# Patient Record
Sex: Female | Born: 1960 | Race: White | Hispanic: No | Marital: Married | Smoking: Former smoker
Health system: Southern US, Community
[De-identification: ages and names within clinical notes are randomized; demographics above are authoritative.]

## PROBLEM LIST (undated history)

## (undated) DIAGNOSIS — E559 Vitamin D deficiency, unspecified: Secondary | ICD-10-CM

## (undated) DIAGNOSIS — E039 Hypothyroidism, unspecified: Secondary | ICD-10-CM

## (undated) DIAGNOSIS — I10 Essential (primary) hypertension: Secondary | ICD-10-CM

## (undated) DIAGNOSIS — E785 Hyperlipidemia, unspecified: Secondary | ICD-10-CM

## (undated) DIAGNOSIS — E119 Type 2 diabetes mellitus without complications: Secondary | ICD-10-CM

## (undated) HISTORY — DX: Hypothyroidism, unspecified: E03.9

## (undated) HISTORY — DX: Hyperlipidemia, unspecified: E78.5

## (undated) HISTORY — DX: Essential (primary) hypertension: I10

## (undated) HISTORY — DX: Type 2 diabetes mellitus without complications: E11.9

## (undated) HISTORY — PX: BACK SURGERY: SHX140

## (undated) HISTORY — DX: Vitamin D deficiency, unspecified: E55.9

## (undated) HISTORY — PX: NASAL SINUS SURGERY: SHX719

---

## 2000-02-04 ENCOUNTER — Encounter: Admission: RE | Admit: 2000-02-04 | Discharge: 2000-02-04 | Payer: Self-pay | Admitting: Neurosurgery

## 2000-02-04 ENCOUNTER — Encounter: Payer: Self-pay | Admitting: Neurosurgery

## 2000-02-11 ENCOUNTER — Encounter: Payer: Self-pay | Admitting: Neurosurgery

## 2000-02-12 ENCOUNTER — Encounter: Payer: Self-pay | Admitting: Neurosurgery

## 2000-02-12 ENCOUNTER — Inpatient Hospital Stay (HOSPITAL_COMMUNITY): Admission: RE | Admit: 2000-02-12 | Discharge: 2000-02-13 | Payer: Self-pay | Admitting: Neurosurgery

## 2002-04-04 ENCOUNTER — Encounter: Payer: Self-pay | Admitting: Neurosurgery

## 2002-04-04 ENCOUNTER — Inpatient Hospital Stay (HOSPITAL_COMMUNITY): Admission: RE | Admit: 2002-04-04 | Discharge: 2002-04-07 | Payer: Self-pay | Admitting: Neurosurgery

## 2009-05-17 ENCOUNTER — Encounter: Admission: RE | Admit: 2009-05-17 | Discharge: 2009-05-17 | Payer: Self-pay | Admitting: Orthopedic Surgery

## 2010-07-09 ENCOUNTER — Encounter: Admission: RE | Admit: 2010-07-09 | Discharge: 2010-07-09 | Payer: Self-pay | Admitting: Neurosurgery

## 2011-02-06 NOTE — Op Note (Signed)
[place]. [facility]  Patient:    Valerie Cruz, Valerie Cruz Visit Number: [id number] MRN: [id number]          Service Type: SUR Location: 3000 3021 01 Attending Physician:  Josie Saunders Dictated by:   Danae Orleans Venetia Maxon, M.D. Proc. 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  PROCEDURE: 1. Redo microdiskectomy at L5-S1, left, with microdissection. 2. Transverse lumbar interbody fusion with Synthes 7 mm bone dawe and    morcellized autograft. 3. Pedicle screw fixation at L5-S1 bilaterally. 4. Posterior lateral arthrodesis L5-S1 bilaterally.  SURGEON:  Danae Orleans. Venetia Maxon, M.D.  ASSISTANT:  Clydene Fake, M.D.  ANESTHESIA:  General endotracheal.  ESTIMATED BLOOD LOSS:  350 cc.  COMPLICATIONS:  None.  DISPOSITION:  Recovery room.  INDICATIONS FOR PROCEDURE:  The patient is a 50 year old woman with left lower extremity pain with recurrent disk herniation at L5-S1 on the left.  It was elected to take her to surgery for redo diskectomy and also fusion at the L5-S1 level.  DESCRIPTION OF PROCEDURE:  The patient was brought to the operating room. Following satisfactory and uncomplicated induction of general endotracheal anesthesia and placement of intravenous lines and Foley catheter, she was placed in the prone position on chest rolls.  Her low back was prepped and draped in the usual sterile fashion.  Area of planned incision was infiltrated with 0.25% Marcaine and 0.50% lidocaine with 1:200,000 epinephrine.  Incision was made in the midline, carried through previous scar tissue to the copious adipose tissues of the lumbodorsal  fascia which was then incised bilaterally exposing the L5 and S1 interspace, and the L5 transverse processes bilaterally along with the sacroiliac bilaterally.  Self-retaining retractor was placed, and an intraoperative x-ray confirmed correct orientation.  Then using loop magnification, a completion of the previous laminectomy of L5 on the left was performed with decompression of the left L5 nerve root and the S1 nerve root. The microscope was brought into the field.  There was a significant amount of scar tissue around the S1 nerve root with a large amount of free fragment of disk material directly beneath the S1 nerve root, and this was decompressed using microdissection technique and a variety of pituitary rongeurs.  The interspace was evacuated of residual disk material.  Attention was then turned to placement of pedicle screw fixation which was done using SD90 pedicle screw system.  Two 5.75 x 40 mm screws were placed on the left, one at the L5 level, and the other at the S1 level.  Similarly, side screws were placed on the right.  Positioning of these screws were confirmed on lateral and AP fluoroscopy.  There was a cut out medially of the L5 pedicle on the left which required initially a 6.75 mm screw which was utilized.  It was subsequently felt that a more laterally placed screw of the smaller caliber would be best in terms of positioning in that pedicle, and a 5.75 mm x 40 mm screw was placed with a somewhat more lateral entry point.  This had good purchase and bone, and did not appear to violate the pedicle.  Subsequently, the right-sided screws were placed under distraction, and locking caps were engaged with 50 mm rods.  The interspace was then vigorously curetted and stripped of residual disk material.  A 7 mm Synthes sizer was found to fit well, and the 7 mm bone dawe was then rehydrated.  The morcelized bone autograft was then inserted into the interspace and countersunk and  placed at the depth of the interspace.  Subsequently, the bone graft was placed and countersunk appropriately.  Following that, additional morcellized bone graft was placed overlying the implant.  The distraction was then removed.  The screws were facing.  Posterior lateral arthrodesis was then performed with bone from the L5 transverse processes to the sacral iliac, along with some onlay bone and Vitoss (15 cc rehydrated with pedicle blood aspirate) was packed along the posterior lateral region on the right along within the facet joint at the L5-S1 level.  Forty millimeter rods were then fixed to the screws, locking mechanisms were engaged.  The wound was irrigated with bacitracin saline prior to placing bone graft.  The nerve roots were felt to be well decompressed.  The self-retaining retractor was then removed.  The posterior lumbar fascia was then reapproximated with 1 Vicryl suture, subcutaneous tissue was reapproximated with 2-0 Vicryl interrupted inverted sutures, and the skin edges were reapproximated with interrupted 3-0 Vicryl subcuticular stitch.  The wound was dressed with Dermabond.  The patient was extubated in the operating room, taken to the recovery room in stable and satisfactory condition, having tolerated the operation well.  Counts were correct at the end of this case. Dictated by:   Danae Orleans Venetia Maxon, M.D. Attending Physician:  Josie Saunders DD:  04/04/02 TD:  04/07/02 Job: [id number]

## 2011-02-06 NOTE — Discharge Summary (Signed)
   NAMELIDA, BERKERY                        ACCOUNT NO.:  [id number]   MEDICAL RECORD NO.:  [id number]                   PATIENT TYPE:  INP   LOCATION:  3021                                 FACILITY:  [facility]   PHYSICIAN:  Danae Orleans. Venetia Maxon, M.D.               DATE OF BIRTH:  December 17, 1960   DATE OF ADMISSION:  04/04/2002  DATE OF DISCHARGE:  04/07/2002                                 DISCHARGE SUMMARY   REASON FOR ADMISSION:  Recurrent lumbar disk herniation, lumbar spondylosis,  lumbar disk degenerative disease, asthma, esophageal reflux and obesity.   FINAL DIAGNOSIS:  Recurrent lumbar disk herniation, lumbar spondylosis,  lumbar disk degenerative disease, asthma, esophageal reflux and obesity.   HISTORY:  The patient is a 50 year old woman with recurrent disk herniation  L5-S1 on the left.  She was admitted for re-do diskectomy and underwent  transverse lumbar antibody fusion with bone graft and pedical screw fixation  L5-S1 levels bilaterally.  Postoperatively she had some left leg pain which  gradually improved.  She was up and mobilized and on the 18th was doing well  and was discharged home in stable and satisfactory condition tolerating her  operation and hospitalization well.   DISCHARGE MEDICATIONS:  Include Percocet, Valium, Elavil.   DISCHARGE INSTRUCTIONS:  She was instructed to wear her brace when up.  No  driving, lifting, bending or twisting.  Follow up with Dr. Venetia Maxon in 4 weeks  with an x-ray.                                               Danae Orleans. Venetia Maxon, M.D.    JDS/MEDQ  D:  05/08/2002  T:  05/10/2002  Job:  [id number]

## 2011-02-06 NOTE — H&P (Signed)
[place]. [facility]  Patient:    Valerie Cruz, Valerie Cruz                     MRN: [id number] Adm. Date:  [id number] Attending:  Josie Saunders                         History and Physical  REASON FOR ADMISSION:  Recurrent disk herniation at L5-S1, left.  HISTORY OF PRESENT ILLNESS:  The patient is a 50 year old woman who had previously undergone microdiskectomy for a herniated lumbar disk at L5-S1 left approximately two years ago.  She did well following this, but then had developed significant recurrence of left leg pain with planta flexion weakness.  She had an MRI without IV contrast that showed a small disk herniation that was felt to be recurrent disk herniation at the L5-S1 level on the left, causing direct compression of the left S1 nerve root.  She has been taking Oxycodone and Valium, but has not had resolution of her pain.  She cannot do her normal activities.  She works as a Statistician. She has been out of work for the past month because of this pain.  She has as absent ankle jerk on the left.  PHYSICAL EXAMINATION:  GENERAL:  The remainder of her physical examination is essentially unremarkable.  She is morbidly obese.  NECK:  Without bruits or masses.  Normal range of motion.  CHEST:  Clear to auscultation.  HEART:  Regular rate and rhythm without murmur.  ABDOMEN:  Soft and nontender.  Active bowel sounds.  No hepatosplenomegaly appreciated.  EXTREMITIES:  Without clubbing, cyanosis, or edema.  She has intact pedal pulses.  NEUROLOGIC:  Positive straight leg raise on the left seated position.  She walks with an antalgic gait, favoring her left lower extremity.  CURRENT MEDICATIONS:  Valium, Oxycodone, Singulair, Allegra, albuterol, Nasonex and Patanol eyedrops.  PAST MEDICAL HISTORY: History of asthma.  SOCIAL HISTORY:  She is a nonsmoker.  IMPRESSION AND RECOMMENDATIONS:  The patient was a 50 year old woman with a recurrent  disk herniation at L5-S1 on the left.  I have recommended to her that she undergo redo microdiskectomy at L5-S1 on the left, given her severe degree of pain.  I reviewed her studies and went over her physical examination.  I went over surgical models and discussed the typical hospital course, operative and postoperative course as well as the potential risks and benefits of surgery.  The risks of surgery were discussed in detail and include (but are not limited to) the risks of anesthesia, blood loss, the possibility of hemorrhage, infection, damage to nerves, damage to blood vessels, injury to the lumbar nerve root causing either temporary or permanent leg pain, numbness and/or weakness.  There is the potential for spinal fluid leak from dural tears, the possibility of post laminectomy spondylolisthesis or recurrent disk herniation.  I quoted her the approximate 10% failure to relieve pain, worsening of pain, the need for further surgery including lumbar fusion.  I went over the diagnostic studies with her and her husband.  I explained that, since she just has radicular pain without any significant back pain, I do not recommend any type of fusion operation be done but that, if she has another disk recurrence, I would likely recommend fusion at that point.  I set her up for surgery on Feb 12, 2000. DD:  02/12/00 TD:  02/12/00 Job: [id number]

## 2011-02-06 NOTE — Op Note (Signed)
[place]. [facility]  Patient:    Valerie Cruz, Valerie Cruz                     MRN: [id number] Proc. Date: 02/12/00 Adm. Date:  [id number] Disc. Date: [id number] Attending:  Josie Saunders                           Operative Report  PREOPERATIVE DIAGNOSIS:  Recurrent lumbar disk herniation L5-S1 left with degenerative disk disease, spondylosis and radiculopathy.  POSTOPERATIVE DIAGNOSIS:  Recurrent lumbar disk herniation L5-S1 left with degenerative disk disease, spondylosis and radiculopathy.  PROCEDURE:  Redo semihemilaminectomy L5-S1 left with microdiskectomy and microdissection.  SURGEON:  Danae Orleans. Venetia Maxon, M.D.  ASSISTANT:  Tanya Nones. Jeral Fruit, M.D.  ANESTHESIA:  General endotracheal anesthesia.  ESTIMATED BLOOD LOSS:  Minimal.  COMPLICATIONS:  None.  DISPOSITION:  Recovery.  INDICATIONS:  Valerie Cruz is a 50 year old woman, who had undergone prior lumbar microdiskectomy L5-S1 left.  She, approximately three weeks ago, developed severe recurrent left leg pain along with weakness and markedly positive straight leg raise.  She had a lumbar MRI, which demonstrated a recurrent disk fragment directly beneath the S1 nerve root and displacing the S1 nerve root.  It was consequently elected to take her to surgery for redo microdiskectomy.  PROCEDURE:  Valerie Cruz was brought to the operating room.  Following the satisfactory and noncomplicated induction of general endotracheal anesthesia and placement of intravenous lines, she was placed in a prone position on the operating table on a Wilson frame.  Her low back was shaved, then prepped and draped in usual sterile fashion.  Area of plain incision was infiltrated with 0.25% Marcaine, 0.5% lidocaine, 1:200,000 epinephrine and incision was made overlying her previous lumbar incision.  In the subperiosteal plane to the L5-S1 interspace, intraoperative x-ray confirmed orientation.  The largest McCullough  self-retaining retractor was placed.  The depth of ______ was approximately 6 inches due to her significant morbid obesity.   The L5 lamina was then thinned using Midas Rex drill and AM8 bur along with the medial facet L5-S1.  Foraminotomy was performed overlying the S1 nerve root.  Residual ligamentous tissue was removed and the lateral aspect of the spinal canal was identified.  Microscope was brought into the field and using high-power visualization microdissection technique, the lateral aspect of the spinal canal was identified.  There was a significant mass directly beneath the S1 nerve root, which was mobilized and a number of fragments of disk material were removed.  The nerve root was felt to be well-decompressed.  Scar tissue along the lateral aspect of the canal was then cauterized and incised with microscissors and mobilized.  The disk space was then incised with a 15 blade and disk material was removed in a piecemeal fashion.  The medial aspect of the disk was decompressed as was the lateral recess.  The disk space was extremely narrowed and degenerated.  Residual disk material was removed.  The S1 nerve root was then felt to be well-decompressed and a coronary dilator was easily inserted out the neuroforamen.  Hemostasis was assured with bipolar electrocautery.  Wound was copiously irrigated with bacitracin saline.  A piece of fat was laid over the laminectomy defect.  The lumbodorsal fascia was then reapproximated with 0 Vicryl sutures.  The subcutaneous tissues reapproximated with 2-0 Vicryl interrupted, inverted sutures.  Skin edges reapproximated with interrupted 3-0 Vicryl subcuticular stitch.  Wound was dressed  with benzoin, Steri-Strips, Telfa gauze and tape.  Patient was extubated in the operating room and taken to recovery room in stable and satisfactory condition, having tolerated her operation well. DD:  02/12/00 TD:  02/16/00 Job: [id number]

## 2011-09-22 HISTORY — PX: CHOLECYSTECTOMY: SHX55

## 2012-09-21 HISTORY — PX: OTHER SURGICAL HISTORY: SHX169

## 2014-04-20 ENCOUNTER — Other Ambulatory Visit: Payer: Self-pay | Admitting: Otolaryngology

## 2014-04-20 DIAGNOSIS — R0981 Nasal congestion: Secondary | ICD-10-CM

## 2014-04-20 DIAGNOSIS — G44009 Cluster headache syndrome, unspecified, not intractable: Secondary | ICD-10-CM

## 2014-04-27 ENCOUNTER — Other Ambulatory Visit: Payer: Self-pay

## 2014-05-04 ENCOUNTER — Ambulatory Visit
Admission: RE | Admit: 2014-05-04 | Discharge: 2014-05-04 | Disposition: A | Discharge status: Home / Self Care | Payer: BC Managed Care – PPO | Source: Ambulatory Visit | Attending: Otolaryngology | Admitting: Otolaryngology

## 2014-05-04 ENCOUNTER — Other Ambulatory Visit: Payer: Self-pay | Admitting: Otolaryngology

## 2014-05-04 DIAGNOSIS — G44009 Cluster headache syndrome, unspecified, not intractable: Secondary | ICD-10-CM

## 2014-05-04 DIAGNOSIS — J329 Chronic sinusitis, unspecified: Secondary | ICD-10-CM

## 2014-05-04 DIAGNOSIS — R0981 Nasal congestion: Secondary | ICD-10-CM

## 2015-09-05 ENCOUNTER — Encounter: Payer: Self-pay | Admitting: Internal Medicine

## 2015-09-05 ENCOUNTER — Ambulatory Visit (INDEPENDENT_AMBULATORY_CARE_PROVIDER_SITE_OTHER): Payer: BLUE CROSS/BLUE SHIELD | Admitting: Internal Medicine

## 2015-09-05 VITALS — BP 118/80 | HR 76 | Ht 65.0 in | Wt 255.8 lb

## 2015-09-05 DIAGNOSIS — R0600 Dyspnea, unspecified: Secondary | ICD-10-CM | POA: Diagnosis not present

## 2015-09-05 DIAGNOSIS — R05 Cough: Secondary | ICD-10-CM

## 2015-09-05 DIAGNOSIS — J45991 Cough variant asthma: Secondary | ICD-10-CM | POA: Insufficient documentation

## 2015-09-05 DIAGNOSIS — R058 Other specified cough: Secondary | ICD-10-CM | POA: Insufficient documentation

## 2015-09-05 LAB — NITRIC OXIDE: NITRIC OXIDE: 15

## 2015-09-05 MED ORDER — AMOXICILLIN-POT CLAVULANATE 875-125 MG PO TABS: 1.0000 | ORAL_TABLET | Freq: Two times a day (BID) | ORAL | Status: AC

## 2015-09-05 MED ORDER — MOMETASONE FURO-FORMOTEROL FUM 100-5 MCG/ACT IN AERO: INHALATION_SPRAY | RESPIRATORY_TRACT | Status: AC

## 2015-09-05 NOTE — Assessment & Plan Note (Addendum)
The most common causes of chronic cough in immunocompetent adults include the following: upper airway cough syndrome (UACS), previously referred to as postnasal drip syndrome (PNDS), which is caused by variety of rhinosinus conditions; (2) asthma; (3) GERD; (4) chronic bronchitis from cigarette smoking or other inhaled environmental irritants; (5) nonasthmatic eosinophilic bronchitis; and (6) bronchiectasis.   These conditions, singly or in combination, have accounted for up to 94% of the causes of chronic cough in prospective studies.   Other conditions have constituted no >6% of the causes in prospective studies These have included bronchogenic carcinoma, chronic interstitial pneumonia, sarcoidosis, left ventricular failure, ACEI-induced cough, and aspiration from a condition associated with pharyngeal dysfunction.    Chronic cough is often simultaneously caused by more than one condition. A single cause has been found from 38 to 82% of the time, multiple causes from 18 to 62%. Multiply caused cough has been the result of three diseases up to 42% of the time.       Based on hx and exam, this is most likely:   cough variant asthma vs  Upper airway cough syndrome, so named because it's frequently impossible to sort out how much is  CR/sinusitis with freq throat clearing (which can be related to primary GERD)   vs  causing  secondary (" extra esophageal")  GERD from wide swings in gastric pressure that occur with throat clearing, often  promoting self use of mint and menthol lozenges that reduce the lower esophageal sphincter tone and exacerbate the problem further in a cyclical fashion.   These are the same pts (now being labeled as having "irritable larynx syndrome" by some cough centers) who not infrequently have a history of having failed to tolerate ace inhibitors,  dry powder inhalers(esp advair)  or biphosphonates or report having atypical reflux symptoms that don't respond to standard doses of PPI  , and are easily confused as having aecopd or asthma flares by even experienced allergists/ pulmonologists.   The first step is to maximize acid suppression and eliminate cyclical coughing / address chronic sinusitis with augmentin x 20 day then sinus ct at day 21 then regroup    I had an extended discussion with the patient reviewing all relevant studies completed to date and  lasting 35/60 min ov       Each maintenance medication was reviewed in detail including most importantly the difference between maintenance and prns and under what circumstances the prns are to be triggered using an action plan format that is not reflected in the computer generated alphabetically organized AVS.    Please see instructions for details which were reviewed in writing and the patient given a copy highlighting the part that I personally wrote and discussed at today's ov.   See instructions for specific recommendations which were reviewed directly with the patient who was given a copy with highlighter outlining the key components.

## 2015-09-05 NOTE — Progress Notes (Signed)
Subjective:    Patient ID: Valerie Cruz, female    DOB: Jan 02, 1961,     MRN: [id number]  HPI  [age] yowf quit smoking age 89 moved to [place] from [place] age 54 then sinus symptoms and wheezing age 54 mold dust pet dander and took shots from allergist in [place]  and helped but still needed nasal rx with antihistamines/ decongestants and  and inhaler (? Sharolyn DouglasSaba) then downhill since then and eval by Kozlow dx of asthma but did not do f/u and referred by Maximino SarinLayne Weaver to pulmonary clinic 09/05/2015    09/05/2015 1st [facility] office visit/ [facility]  maint rx with advair  Chief Complaint  Patient presents with  . PULMONARY CONSULT    Pt referred by Maximino SarinLayne Weaver for SOB and wheezing and a bad cough. pt states shes been with these symptoms since March and it has progressed since than. Pt states she has a lot of sinus infection. pt c/o beinmg SOB with any kind of movement and a very prod cough white and sometimes yellowis in color.   last eval by Ezzard StandingNewman one year prior to OV  > turned down surgyer  Main concern is "head feels bad" and bad cough esp in am  With variably purulent sputum, esp in am   Mostly sob when coughing / worse symptoms with  smoke/ perfume exposure  No obvious   patterns in day to day or daytime variabilty or assoc   cp or chest tightness, subjective wheeze or overt   hb symptoms. No unusual exp hx or h/o childhood pna/ asthma or knowledge of premature birth.   Also denies any obvious fluctuation of symptoms with weather or environmental changes or other aggravating or alleviating factors except as outlined above   Current Medications, Allergies, Complete Past Medical History, Past Surgical History, Family History, and Social History were reviewed in [facility] electronic medical record.                Review of Systems  Constitutional: Positive for fever and unexpected weight change.  HENT: Positive for congestion, postnasal drip, rhinorrhea, sinus pressure and sore throat.  Negative for dental problem, ear pain, nosebleeds, sneezing and trouble swallowing.   Eyes: Positive for redness and itching.  Respiratory: Positive for cough and shortness of breath. Negative for chest tightness.   Cardiovascular: Positive for palpitations and leg swelling.  Gastrointestinal: Positive for nausea and vomiting.  Genitourinary: Negative for dysuria.  Musculoskeletal: Positive for joint swelling.  Skin: Negative for rash.  Neurological: Positive for headaches.  Hematological: Does not bruise/bleed easily.  Psychiatric/Behavioral: Negative for dysphoric mood. The patient is nervous/anxious.        Objective:   Physical Exam   amb obese wf unusual affect  Wt Readings from Last 3 Encounters:  09/05/15 255 lb 12.8 oz (116.03 kg)    Vital signs reviewed   HEENT: nl dentition, and oropharynx. Nl external ear canals without cough reflex Moderate non specific swelling of turbinates bilaterally    NECK :  without JVD/Nodes/TM/ nl carotid upstrokes bilaterally   LUNGS: no acc muscle use,  Nl contour chest which is clear to A and P bilaterally without cough on insp or exp maneuvers   CV:  RRR  no s3 or murmur or increase in P2, no edema   ABD:  soft and nontender with nl inspiratory excursion in the supine position. No bruits or organomegaly, bowel sounds nl  MS:  Nl gait/ ext warm without deformities, calf tenderness,  cyanosis or clubbing No obvious joint restrictions   SKIN: warm and dry without lesions    NEURO:  alert, approp, nl sensorium with  no motor deficits         Assessment & Plan:

## 2015-09-05 NOTE — Assessment & Plan Note (Signed)
Body mass index is 42.57 kg/(m^2).  No results found for: TSH   Contributing to gerd tendency/ doe/reviewed the need and the process to achieve and maintain neg calorie balance > defer f/u primary care including intermittently monitoring thyroid status

## 2015-09-05 NOTE — Assessment & Plan Note (Addendum)
09/05/2015  extensive coaching HFA effectiveness =    75%  - spirometry 09/05/2015 with min airflow obst > try dulera 100 2bid in place of advair - NO 09/05/2015 = 15  If she has asthma at all it is likely related to a sinobronchial reflex from poorly controlled sinusitis and that will  Need to be addressed as a priority   Reviewed with pt who appears to be doctor shopping between [place]/Kozlow and me:   Unlike when you get a prescription for eyeglasses, it's not possible to always walk out of this or any medical office with a perfect prescription that is immediately effective  based on any test that we offer here.    On the contrary, it may take several weeks for the full impact of changes recommened today - hopefully you will respond well.  If not, then we'll adjust your medication on your next visit accordingly, knowing more then than we can possibly know now.

## 2015-09-05 NOTE — Patient Instructions (Addendum)
Stop advair and start dulera 100 Take 2 puffs first thing in am and then another 2 puffs about 12 hours later.   Best cough med is mucinex dm 1200 mg every 12 hours as needed  Change prilosec to 20 mg Take 30- 60 min before your first and last meals of the day   GERD (REFLUX)  is an extremely common cause of respiratory symptoms just like yours , many times with no obvious heartburn at all.    It can be treated with medication, but also with lifestyle changes including elevation of the head of your bed (ideally with 6 inch  bed blocks),  Smoking cessation, avoidance of late meals, excessive alcohol, and avoid fatty foods, chocolate, peppermint, colas, red wine, and acidic juices such as orange juice.  NO MINT OR MENTHOL PRODUCTS SO NO COUGH DROPS  USE SUGARLESS CANDY INSTEAD (Jolley ranchers or Stover's or Life Savers) or even ice chips will also do - the key is to swallow to prevent all throat clearing. NO OIL BASED VITAMINS - use powdered substitutes.    Augmentin 875 mg take one pill twice daily  X 20 days - take at breakfast and supper with large glass of water.  It would help reduce the usual side effects (diarrhea and yeast infections) if you ate cultured yogurt at lunch.   Please see patient coordinator before you leave today  to schedule sinus ct in 3 weeks, no sooner   Please schedule a follow up office visit in 4 weeks, sooner if needed

## 2015-09-05 NOTE — Assessment & Plan Note (Signed)
09/05/2015  Walked RA x 3 laps @ 185 ft each stopped due to  End of study, brisk pace, no sob or desat    Not able to reproduce this symptom which is more support for UACS > asthma

## 2015-09-10 ENCOUNTER — Encounter: Payer: Self-pay | Admitting: Cardiovascular Disease

## 2015-09-10 ENCOUNTER — Ambulatory Visit (INDEPENDENT_AMBULATORY_CARE_PROVIDER_SITE_OTHER): Payer: BLUE CROSS/BLUE SHIELD | Admitting: Cardiovascular Disease

## 2015-09-10 VITALS — BP 122/76 | HR 89 | Ht 65.0 in | Wt 254.2 lb

## 2015-09-10 DIAGNOSIS — R5383 Other fatigue: Secondary | ICD-10-CM

## 2015-09-10 DIAGNOSIS — R0602 Shortness of breath: Secondary | ICD-10-CM | POA: Diagnosis not present

## 2015-09-10 DIAGNOSIS — E785 Hyperlipidemia, unspecified: Secondary | ICD-10-CM

## 2015-09-10 DIAGNOSIS — M7989 Other specified soft tissue disorders: Secondary | ICD-10-CM

## 2015-09-10 DIAGNOSIS — Z136 Encounter for screening for cardiovascular disorders: Secondary | ICD-10-CM | POA: Diagnosis not present

## 2015-09-10 NOTE — Patient Instructions (Signed)
Your physician recommends that you schedule a follow-up appointment in: 1 month with Dr Purvis SheffieldKoneswaran   Your physician has requested that you have a lexiscan myoview. For further information please visit [email or web address]. Please follow instruction sheet, as given.  HOLD your metformin and insulin the day of test   Your physician has requested that you have an echocardiogram. Echocardiography is a painless test that uses sound waves to create images of your heart. It provides your doctor with information about the size and shape of your heart and how well your heart's chambers and valves are working. This procedure takes approximately one hour. There are no restrictions for this procedure.    Your physician recommends that you continue on your current medications as directed. Please refer to the Current Medication list given to you today.    Thank you for choosing [facility] !

## 2015-09-10 NOTE — Progress Notes (Signed)
Patient ID: Valerie SimpsonBarbara Cruz, female   DOB: 06/05/1961, 54 y.o.   MRN: [id number]       CARDIOLOGY CONSULT NOTE  Patient ID: Valerie SimpsonBarbara Cruz MRN: [id number] DOB/AGE: 06/05/1961 54 y.o.  Admit date: (Not on file) Primary Physician Kirstie PeriSHAH,ASHISH, MD  Referring provider: Maximino SarinLayne Weaver (FNP-C)  Reason for Consultation: SOB  HPI: The patient is a 54 year old woman who is morbidly obese and has diabetes, hyperlipidemia, and hypothyroidism, who has been experiencing dyspnea and has an upper airway cough syndrome. She was recently evaluated by pulmonary. She has also had some lower extremity swelling for which she was prescribed Lasix and compression stockings by her PCP. She has been referred by Maximino SarinLayne Weaver (FNP-C) to see if her lower extremity swelling cardiac in etiology.  She has been noticing the swelling over the past several months but more so in the past one or two months. It has gotten to the point where she has to take Lasix 40 mg daily in order to alleviate it. She notices it more on the left than the right side of her body. She denies chest pain but has been progressively more fatigued over the past year. She has occasional dizziness but denies syncope. She does describe what appears to be paroxysmal nocturnal dyspnea.  ECG performed in the office today demonstrates sinus rhythm with a diffuse nonspecific T wave abnormality and late R-wave transition.  Fam: Her father died of myocardial infarction at the age of 56. He was a smoker.   Allergies  Allergen Reactions  . Clindamycin/Lincomycin Shortness Of Breath  . Vancomycin Shortness Of Breath  . Doxycycline Hives    Current Outpatient Prescriptions  Medication Sig Dispense Refill  . amoxicillin-clavulanate (AUGMENTIN) 875-125 MG tablet Take 1 tablet by mouth 2 (two) times daily. 40 tablet 0  . atorvastatin (LIPITOR) 10 MG tablet Take 1 tablet by mouth daily.  1  . azelastine (ASTELIN) 0.1 % nasal spray Place 2 sprays into both  nostrils as needed.  0  . fexofenadine (ALLERGY RELIEF) 180 MG tablet Take 180 mg by mouth daily.    . furosemide (LASIX) 40 MG tablet Take 40 mg by mouth daily.    Marland Kitchen. LEVEMIR FLEXTOUCH 100 UNIT/ML Pen Inject sub Q 50 units int the morning and 100 units in the evening  1  . levothyroxine (SYNTHROID, LEVOTHROID) 137 MCG tablet Take 1 tablet by mouth daily.  11  . metFORMIN (GLUCOPHAGE) 1000 MG tablet Take 1 tablet by mouth 2 (two) times daily.  2  . mometasone-formoterol (DULERA) 100-5 MCG/ACT AERO Take 2 puffs first thing in am and then another 2 puffs about 12 hours later. 1 Inhaler 11  . NOVOLOG FLEXPEN 100 UNIT/ML FlexPen Inject sub Q 15 units every morning, 15 units in the afternoon, and 25 units in the evening  2  . omeprazole (PRILOSEC) 20 MG capsule Take 20 mg by mouth daily.    . VENTOLIN HFA 108 (90 BASE) MCG/ACT inhaler Inhale 2 puffs into the lungs every 6 (six) hours as needed.  0   No current facility-administered medications for this visit.    Past Medical History  Diagnosis Date  . Diabetes mellitus (HCC)   . Hyperlipidemia   . Hypothyroidism   . Vitamin D deficiency   . Essential hypertension     Past Surgical History  Procedure Laterality Date  . Right knee torn cartilage repair  2014  . Back surgery    . Cholecystectomy  2013  . Nasal sinus surgery  Social History   Social History  . Marital Status: Married    Spouse Name: N/A  . Number of Children: N/A  . Years of Education: N/A   Occupational History  . Not on file.   Social History Main Topics  . Smoking status: Former Smoker    Quit date: 06/21/1988  . Smokeless tobacco: Not on file  . Alcohol Use: Not on file  . Drug Use: Not on file  . Sexual Activity: Not on file   Other Topics Concern  . Not on file   Social History Narrative       Prior to Admission medications   Medication Sig Start Date End Date Taking? Authorizing Provider  amoxicillin-clavulanate (AUGMENTIN) 875-125 MG  tablet Take 1 tablet by mouth 2 (two) times daily. 09/05/15  Yes Nyoka Cowden, MD  atorvastatin (LIPITOR) 10 MG tablet Take 1 tablet by mouth daily. 08/03/15  Yes Historical Provider, MD  azelastine (ASTELIN) 0.1 % nasal spray Place 2 sprays into both nostrils as needed. 07/04/15  Yes Historical Provider, MD  fexofenadine (ALLERGY RELIEF) 180 MG tablet Take 180 mg by mouth daily.   Yes Historical Provider, MD  furosemide (LASIX) 40 MG tablet Take 40 mg by mouth daily.   Yes Historical Provider, MD  LEVEMIR FLEXTOUCH 100 UNIT/ML Pen Inject sub Q 50 units int the morning and 100 units in the evening 08/05/15  Yes Historical Provider, MD  levothyroxine (SYNTHROID, LEVOTHROID) 137 MCG tablet Take 1 tablet by mouth daily. 08/03/15  Yes Historical Provider, MD  metFORMIN (GLUCOPHAGE) 1000 MG tablet Take 1 tablet by mouth 2 (two) times daily. 08/03/15  Yes Historical Provider, MD  mometasone-formoterol (DULERA) 100-5 MCG/ACT AERO Take 2 puffs first thing in am and then another 2 puffs about 12 hours later. 09/05/15  Yes Nyoka Cowden, MD  NOVOLOG FLEXPEN 100 UNIT/ML FlexPen Inject sub Q 15 units every morning, 15 units in the afternoon, and 25 units in the evening 07/06/15  Yes Historical Provider, MD  omeprazole (PRILOSEC) 20 MG capsule Take 20 mg by mouth daily.   Yes Historical Provider, MD  VENTOLIN HFA 108 (90 BASE) MCG/ACT inhaler Inhale 2 puffs into the lungs every 6 (six) hours as needed. 07/01/15  Yes Historical Provider, MD     Review of systems complete and found to be negative unless listed above in HPI     Physical exam Blood pressure 122/76, pulse 89, height  (1.651 m), weight 254 lb 3.2 oz (115.304 kg), SpO2 98 %. General: NAD Neck: No JVD, no thyromegaly or thyroid nodule.  Lungs: Clear to auscultation bilaterally with normal respiratory effort. CV: Nondisplaced PMI. Regular rate and rhythm, normal S1/S2, no S3/S4, no murmur.  Trace peripheral edema.  No carotid bruit.      Abdomen: Obese.  Skin: Intact without lesions or rashes.  Neurologic: Alert and oriented x 3.  Psych: Normal affect. Extremities: No clubbing or cyanosis.  HEENT: Normal.   ECG: Most recent ECG reviewed.  Labs:  No results found for: WBC, HGB, HCT, MCV, PLT No results for input(s): NA, K, CL, CO2, BUN, CREATININE, CALCIUM, PROT, BILITOT, ALKPHOS, ALT, AST, GLUCOSE in the last 168 hours.  Invalid input(s): LABALBU No results found for: CKTOTAL, CKMB, CKMBINDEX, TROPONINI No results found for: CHOL No results found for: HDL No results found for: LDLCALC No results found for: TRIG No results found for: CHOLHDL No results found for: LDLDIRECT       Studies: No results found.  ASSESSMENT AND PLAN:  1. Progressive dyspnea, leg swelling, and fatigue: Risk factors for CAD include diabetes, hyperlipidemia, obesity, and family history. Uncertain whether leg swelling is due to morbid obesity and consequent venous insufficiency or reduced left ventricular systolic function or diastolic dysfunction. Will obtain an echocardiogram for further clarification. Will also obtain a Lexiscan nuclear stress test to rule out ischemic heart disease as a potential etiology.  2. Hyperlipidemia: On Lipitor.  Dispo: f/u 1 month.   Signed: Prentice Docker, M.D., F.A.C.C.  09/10/2015, 8:45 AM

## 2015-09-18 ENCOUNTER — Ambulatory Visit (HOSPITAL_COMMUNITY): Payer: BLUE CROSS/BLUE SHIELD | Attending: Cardiovascular Disease

## 2015-09-18 ENCOUNTER — Encounter (HOSPITAL_COMMUNITY): Payer: BLUE CROSS/BLUE SHIELD

## 2015-09-18 ENCOUNTER — Ambulatory Visit (HOSPITAL_COMMUNITY): Admission: RE | Admit: 2015-09-18 | Payer: BLUE CROSS/BLUE SHIELD | Source: Ambulatory Visit

## 2015-09-27 ENCOUNTER — Ambulatory Visit (HOSPITAL_COMMUNITY): Payer: BLUE CROSS/BLUE SHIELD

## 2015-09-27 ENCOUNTER — Other Ambulatory Visit (HOSPITAL_COMMUNITY): Payer: BLUE CROSS/BLUE SHIELD

## 2015-10-09 ENCOUNTER — Telehealth: Payer: Self-pay | Admitting: Internal Medicine

## 2015-10-09 NOTE — Telephone Encounter (Signed)
atc pt's work line, did not have an extension to connect to pt. lmtcb X1 on pt's home #

## 2015-10-10 NOTE — Telephone Encounter (Signed)
lmtcb x2 for pt. 

## 2015-10-11 ENCOUNTER — Ambulatory Visit: Payer: BLUE CROSS/BLUE SHIELD | Admitting: Internal Medicine

## 2015-10-11 NOTE — Telephone Encounter (Signed)
lmtcb X3 for pt.  Will close message per triage protocol.  

## 2015-10-24 ENCOUNTER — Ambulatory Visit: Payer: BLUE CROSS/BLUE SHIELD | Admitting: Cardiovascular Disease

## 2016-03-18 ENCOUNTER — Other Ambulatory Visit: Payer: Self-pay | Admitting: Otolaryngology

## 2016-03-18 DIAGNOSIS — R51 Headache: Secondary | ICD-10-CM

## 2016-03-18 DIAGNOSIS — R0981 Nasal congestion: Secondary | ICD-10-CM

## 2016-03-18 DIAGNOSIS — R519 Headache, unspecified: Secondary | ICD-10-CM

## 2016-03-18 DIAGNOSIS — J328 Other chronic sinusitis: Secondary | ICD-10-CM

## 2016-03-26 ENCOUNTER — Ambulatory Visit
Admission: RE | Admit: 2016-03-26 | Discharge: 2016-03-26 | Disposition: A | Discharge status: Home / Self Care | Payer: BLUE CROSS/BLUE SHIELD | Source: Ambulatory Visit | Attending: Otolaryngology | Admitting: Otolaryngology

## 2016-03-26 DIAGNOSIS — R51 Headache: Secondary | ICD-10-CM

## 2016-03-26 DIAGNOSIS — J328 Other chronic sinusitis: Secondary | ICD-10-CM

## 2016-03-26 DIAGNOSIS — R519 Headache, unspecified: Secondary | ICD-10-CM

## 2016-09-30 ENCOUNTER — Encounter (INDEPENDENT_AMBULATORY_CARE_PROVIDER_SITE_OTHER): Payer: Self-pay | Admitting: Orthopaedic Surgery

## 2016-09-30 ENCOUNTER — Ambulatory Visit (INDEPENDENT_AMBULATORY_CARE_PROVIDER_SITE_OTHER): Payer: BLUE CROSS/BLUE SHIELD

## 2016-09-30 ENCOUNTER — Ambulatory Visit (INDEPENDENT_AMBULATORY_CARE_PROVIDER_SITE_OTHER): Payer: BLUE CROSS/BLUE SHIELD | Admitting: Orthopaedic Surgery

## 2016-09-30 VITALS — Ht 65.0 in | Wt 260.0 lb

## 2016-09-30 DIAGNOSIS — M25562 Pain in left knee: Secondary | ICD-10-CM

## 2016-09-30 DIAGNOSIS — G8929 Other chronic pain: Secondary | ICD-10-CM

## 2016-09-30 NOTE — Progress Notes (Signed)
   Office Visit Note   Patient: Valerie SimpsonBarbara Clevinger           Date of Birth: [birth year]           MRN: [id number] Visit Date: 09/30/2016              Requested by: Kirstie PeriAshish Shah, MD [id number] [place], [place] [id number] PCP: Kirstie PeriSHAH,ASHISH, MD   Assessment & Plan: Visit Diagnoses: Osteoarthritis left knee with predominantly medial joint pain.  Plan: Long discussion regarding weight loss, exercises, appropriate anti-inflammatory medicines, and potential cortisone and/or Visco supplementation in the future. Samples of Pennsaid said were given to the patient. Might consider an MRI scan at some point in the future if no improvement with the above.  Follow-Up Instructions: No Follow-up on file.   Orders:  No orders of the defined types were placed in this encounter.  No orders of the defined types were placed in this encounter.     Procedures: No procedures performed   Clinical Data: No additional findings.   Subjective: Chief Complaint  Patient presents with  . Left Knee - Pain, Edema, Numbness    Left knee pain going on for 2 weeks. Hard to go up and down stairs, came on suddenly.  Pt taking ibuprofen.  Patient denies history of injury or trauma. Pain in left knee is predominantly along the medial compartment. He does have history of diabetes and elevation of blood sugar in past with cortisone  Review of Systems   Objective: Vital Signs: Ht 5\' 5"  (1.651 m)   Wt 260 lb (117.9 kg)   BMI 43.27 kg/m   Physical Exam  Ortho Exam left knee exam with minimal effusion. Predominantly medial joint pain that is diffuse. No instability. No calf pain or swelling. Neurovascular exam intact. Full extension. Flexed about 100 with patellar crepitation  Specialty Comments:  No specialty comments available.  Imaging: No results found.   PMFS History: Patient Active Problem List   Diagnosis Date Noted  . Dyspnea 09/05/2015  . Upper airway cough syndrome 09/05/2015  . Cough variant  asthma 09/05/2015  . Severe obesity (BMI >= 40) (HCC) 09/05/2015   Past Medical History:  Diagnosis Date  . Diabetes mellitus (HCC)   . Essential hypertension   . Hyperlipidemia   . Hypothyroidism   . Vitamin D deficiency     No family history on file.  Past Surgical History:  Procedure Laterality Date  . BACK SURGERY    . CHOLECYSTECTOMY  2013  . NASAL SINUS SURGERY    . right knee torn cartilage repair  2014   Social History   Occupational History  . Not on file.   Social History Main Topics  . Smoking status: Former Smoker    Quit date: 06/21/1988  . Smokeless tobacco: Not on file  . Alcohol use Not on file  . Drug use: Unknown  . Sexual activity: Not on file

## 2016-12-09 IMAGING — CT CT MAXILLOFACIAL W/O CM
1 series · 15 of 30 positions shown, 19 images · non-contrast
Comparison: CT face 05/04/2014.

CLINICAL DATA: 54-year-old female with sinus congestion, headaches
and dizziness for greater than 3 months. Status post a course of
antibiotics and prednisone recently. Stereotactic surgical planning.
Initial encounter.

EXAM:
CT MAXILLOFACIAL WITHOUT CONTRAST
TECHNIQUE: Multidetector CT imaging of the maxillofacial structures was
performed. Multiplanar CT image reconstructions were also generated.
A small metallic BB was placed on the right temple in order to
reliably differentiate right from left.

[Series 4: soft tissue · axial · 0.44mm/px · z∈[-241,-53]mm · 15 of 202 slices shown, 19 images]
[im 7/202  brain]
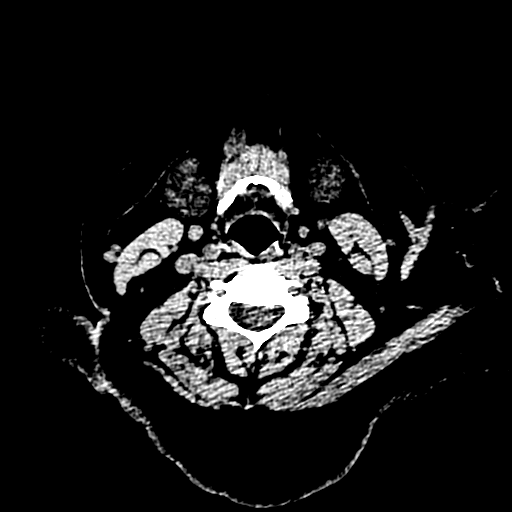
[im 7/202  bone]
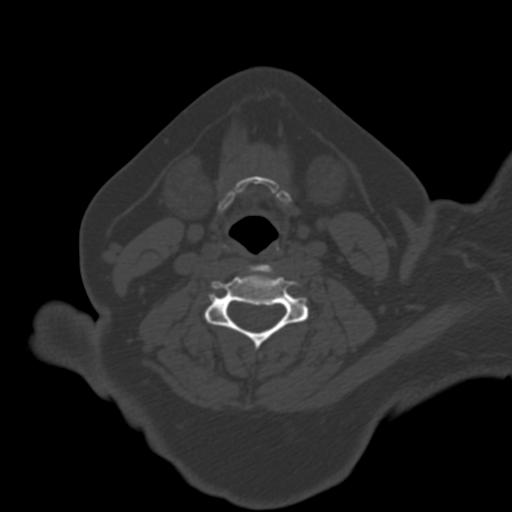
[im 21/202  bone]
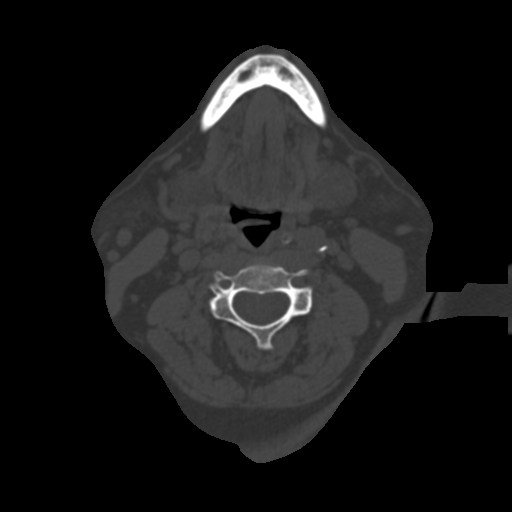
[im 35/202  bone]
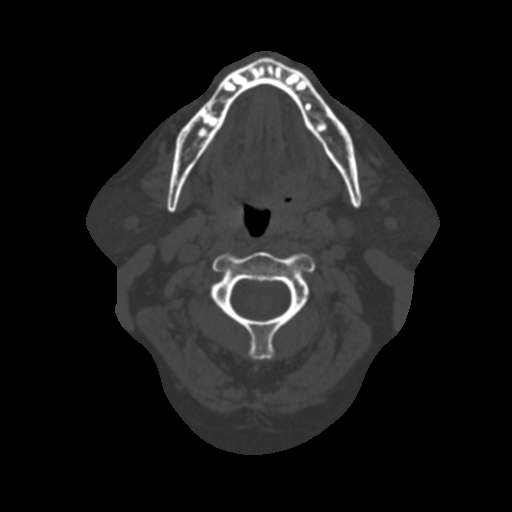
[im 49/202  bone]
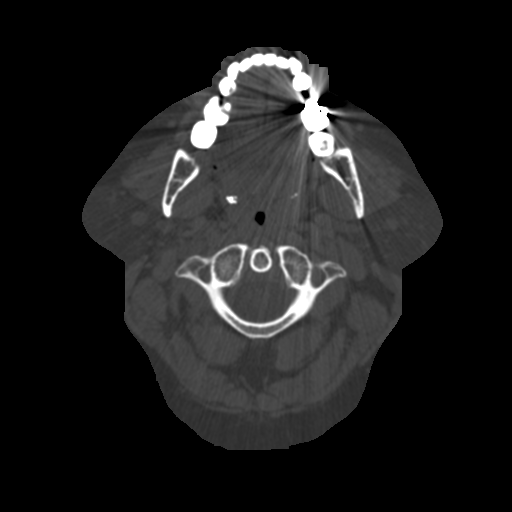
[im 63/202  brain]
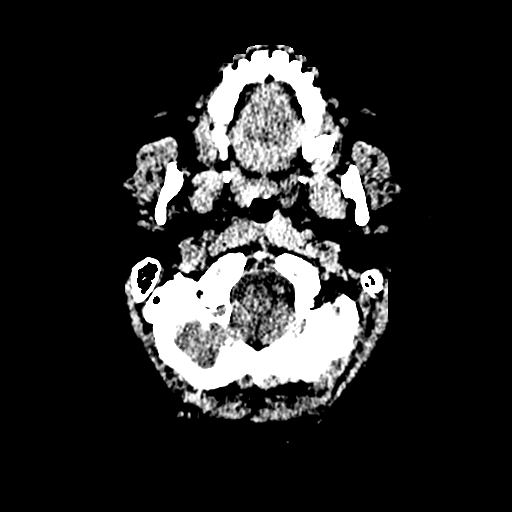
[im 63/202  bone]
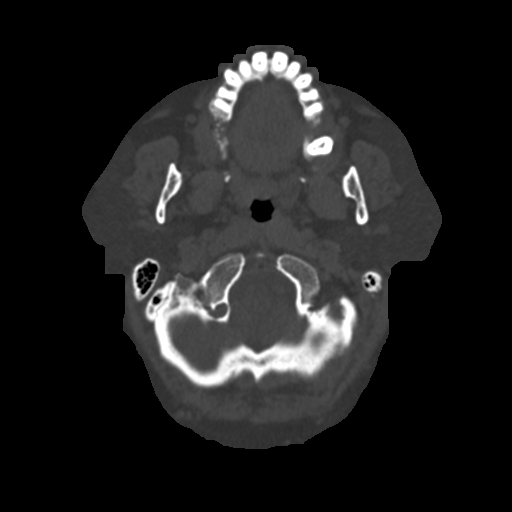
[im 77/202  bone]
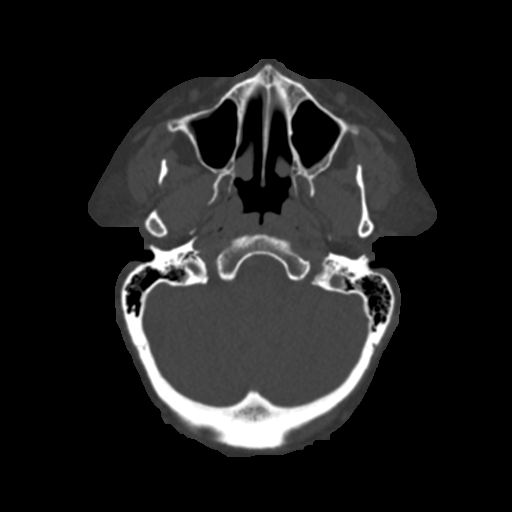
[im 91/202  bone]
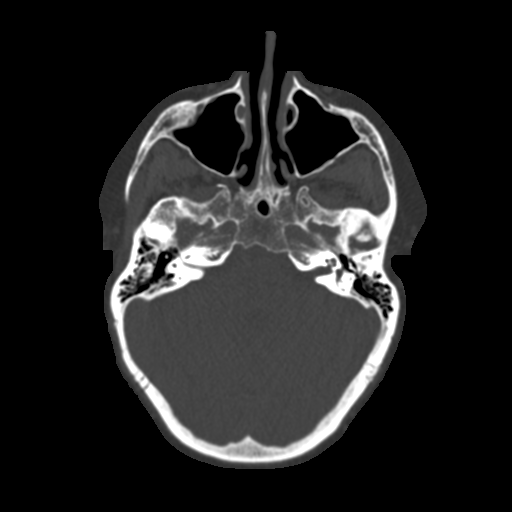
[im 104/202  bone]
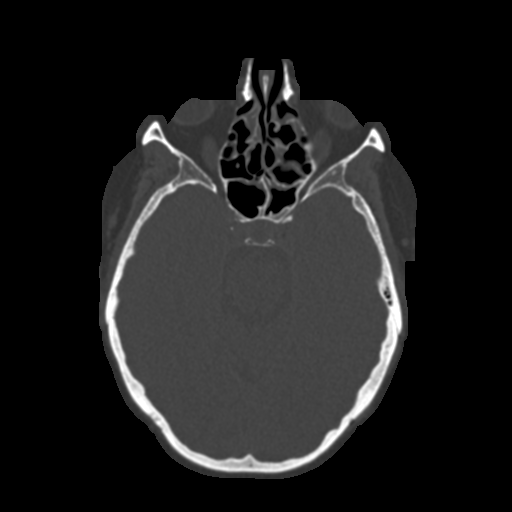
[im 111/202  brain]
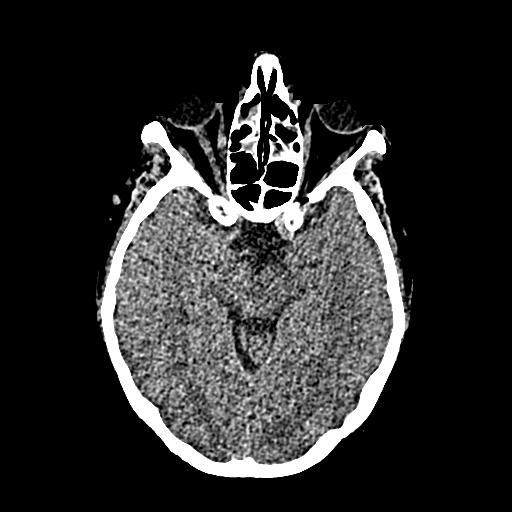
[im 111/202  bone]
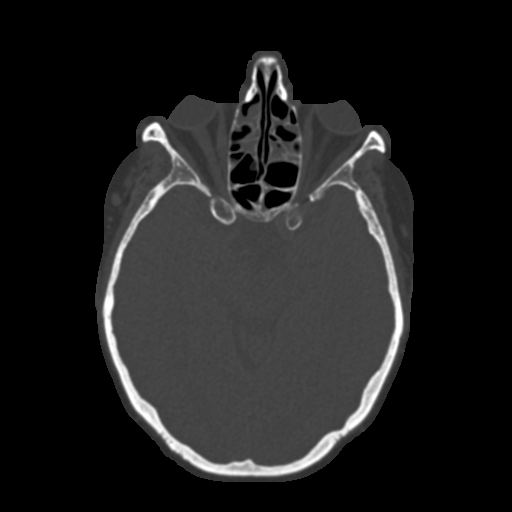
[im 125/202  bone]
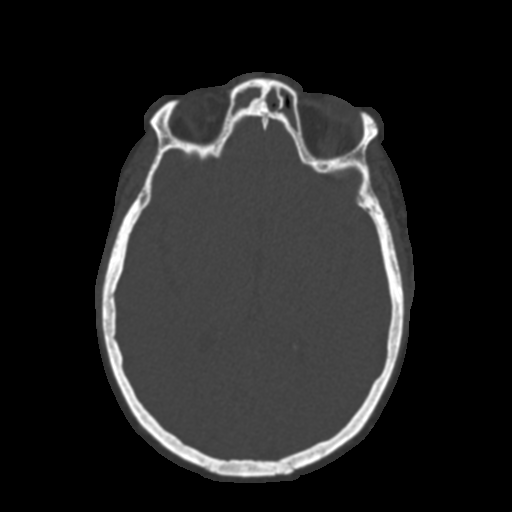
[im 139/202  bone]
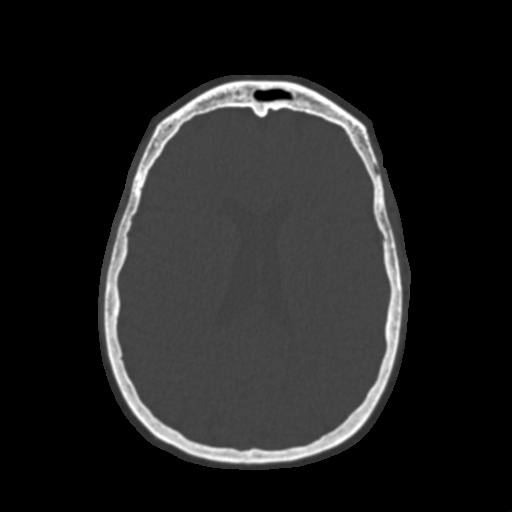
[im 153/202  bone]
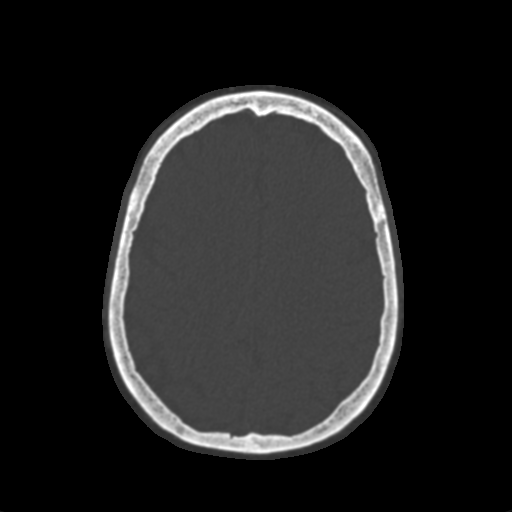
[im 167/202  brain]
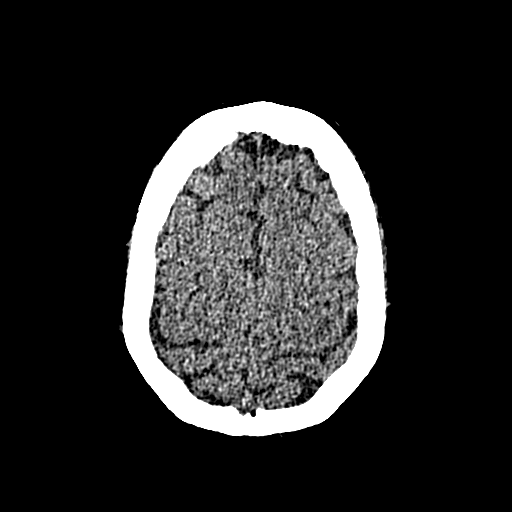
[im 167/202  bone]
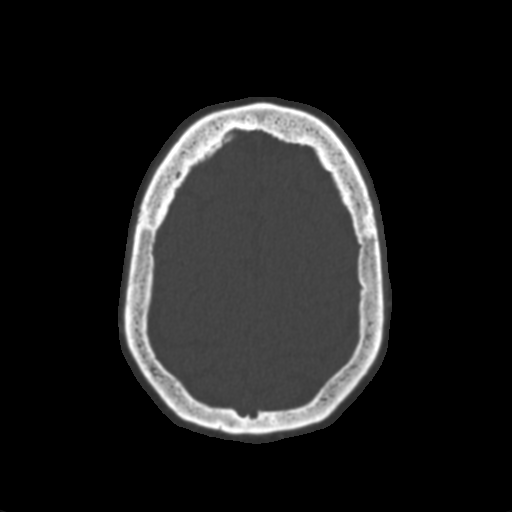
[im 181/202  bone]
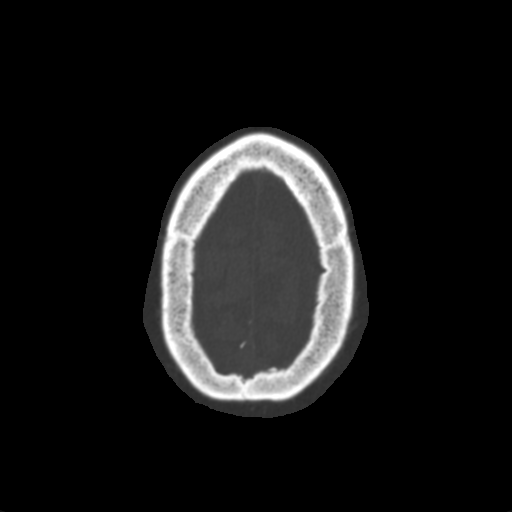
[im 195/202  bone]
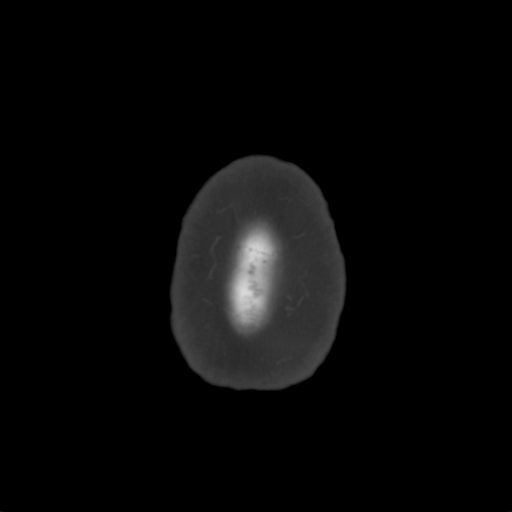

[15 of 30 positions shown; findings below may reference images not displayed]

FINDINGS: Negative visualized noncontrast brain parenchyma. The left vertebral
artery may be dominant. Calcified atherosclerosis at the skull base.
Negative orbit and scalp soft tissues. Negative visualized
noncontrast deep soft tissue spaces of the face and neck. The
incidental dystrophic palatine tonsil calcifications.

Tympanic cavities and mastoids remain clear.

Mild circumferential mucosal thickening in both sphenoid sinuses.
Despite this both show improve pneumatization compared 0186,
especially on the left.

Scattered mild to moderate mucosal thickening and opacification of
the ethmoid air cells, more so the anterior ethmoids. Ethmoid
pneumatization also is improved compared 0186.

Right frontal sinus remains completely opacified and is stable.
Mildly improved pneumatization of the left frontal sinus, with
moderate residual mucosal thickening.

Regressed circumferential mucosal thickening in both maxillary
sinuses, with minimal to mild residual. Chronic appearing
postoperative changes to both OMCs, with evidence of middle
turbinate resection, as seen on coronal image 26.

Symmetric mild nasal cavity mucosal thickening. Generalized mild
paranasal sinus periosteal thickening. Left greater than right TMJ
degeneration. No acute osseous abnormality identified.
IMPRESSION: 1. Chronic pansinus inflammation, improved compared to 0186 except
in the right frontal sinus. Up to moderate residual mucosal
thickening in the frontal and anterior ethmoid sinuses.
2. Chronic postoperative changes to the ostiomeatal complexes.

## 2022-09-02 ENCOUNTER — Other Ambulatory Visit: Payer: Self-pay | Admitting: Internal Medicine

## 2022-09-02 ENCOUNTER — Ambulatory Visit
Admission: RE | Admit: 2022-09-02 | Discharge: 2022-09-02 | Disposition: A | Discharge status: Home / Self Care | Payer: Commercial Managed Care - PPO | Source: Ambulatory Visit | Attending: Internal Medicine | Admitting: Internal Medicine

## 2022-09-02 DIAGNOSIS — Z1231 Encounter for screening mammogram for malignant neoplasm of breast: Secondary | ICD-10-CM
